# Patient Record
Sex: Male | Born: 1985 | Hispanic: Yes | Marital: Single
Health system: Southern US, Community
[De-identification: ages and names within clinical notes are randomized; demographics above are authoritative.]

---

## 2020-01-05 ENCOUNTER — Emergency Department (HOSPITAL_COMMUNITY): Payer: Medicaid Other

## 2020-01-05 ENCOUNTER — Emergency Department (HOSPITAL_COMMUNITY)
Admission: EM | Admit: 2020-01-05 | Discharge: 2020-01-05 | Disposition: A | Discharge status: Home / Self Care | Payer: Medicaid Other | Attending: Emergency Medicine | Admitting: Emergency Medicine

## 2020-01-05 ENCOUNTER — Encounter (HOSPITAL_COMMUNITY): Payer: Self-pay

## 2020-01-05 DIAGNOSIS — R109 Unspecified abdominal pain: Secondary | ICD-10-CM | POA: Diagnosis present

## 2020-01-05 DIAGNOSIS — N200 Calculus of kidney: Secondary | ICD-10-CM | POA: Insufficient documentation

## 2020-01-05 LAB — CBC WITH DIFFERENTIAL/PLATELET
Abs Immature Granulocytes: 0.07 10*3/uL (ref 0.00–0.07)
Basophils Absolute: 0.1 10*3/uL (ref 0.0–0.1)
Basophils Relative: 0 %
Eosinophils Absolute: 0 10*3/uL (ref 0.0–0.5)
Eosinophils Relative: 0 %
HCT: 50.3 % (ref 39.0–52.0)
Hemoglobin: 16.7 g/dL (ref 13.0–17.0)
Immature Granulocytes: 0 %
Lymphocytes Relative: 7 %
Lymphs Abs: 1.2 10*3/uL (ref 0.7–4.0)
MCH: 28.8 pg (ref 26.0–34.0)
MCHC: 33.2 g/dL (ref 30.0–36.0)
MCV: 86.7 fL (ref 80.0–100.0)
Monocytes Absolute: 0.7 10*3/uL (ref 0.1–1.0)
Monocytes Relative: 4 %
Neutro Abs: 15.5 10*3/uL — ABNORMAL HIGH (ref 1.7–7.7)
Neutrophils Relative %: 89 %
Platelets: 308 10*3/uL (ref 150–400)
RBC: 5.8 MIL/uL (ref 4.22–5.81)
RDW: 13.4 % (ref 11.5–15.5)
WBC: 17.4 10*3/uL — ABNORMAL HIGH (ref 4.0–10.5)
nRBC: 0 % (ref 0.0–0.2)

## 2020-01-05 LAB — URINALYSIS, ROUTINE W REFLEX MICROSCOPIC
Bacteria, UA: NONE SEEN
Bilirubin Urine: NEGATIVE
Glucose, UA: NEGATIVE mg/dL
Ketones, ur: NEGATIVE mg/dL
Leukocytes,Ua: NEGATIVE
Nitrite: NEGATIVE
Protein, ur: NEGATIVE mg/dL
RBC / HPF: >50 RBC/hpf — ABNORMAL HIGH (ref 0–5)
Specific Gravity, Urine: 1.015 (ref 1.005–1.030)
pH: 7 (ref 5.0–8.0)

## 2020-01-05 LAB — I-STAT CHEM 8, ED
BUN: 15 mg/dL (ref 6–20)
Calcium, Ion: 1.24 mmol/L (ref 1.15–1.40)
Chloride: 108 mmol/L (ref 98–111)
Creatinine, Ser: 1.1 mg/dL (ref 0.61–1.24)
Glucose, Bld: 147 mg/dL — ABNORMAL HIGH (ref 70–99)
HCT: 52 % (ref 39.0–52.0)
Hemoglobin: 17.7 g/dL — ABNORMAL HIGH (ref 13.0–17.0)
Potassium: 3.5 mmol/L (ref 3.5–5.1)
Sodium: 144 mmol/L (ref 135–145)
TCO2: 23 mmol/L (ref 22–32)

## 2020-01-05 MED ORDER — OXYCODONE-ACETAMINOPHEN 5-325 MG PO TABS
2.0000 | ORAL_TABLET | ORAL | 0 refills | Status: AC | PRN
Start: 2020-01-05 — End: ?

## 2020-01-05 MED ORDER — ONDANSETRON HCL 4 MG/2ML IJ SOLN
4.0000 mg | Freq: Once | INTRAMUSCULAR | Status: AC
  Administered 2020-01-05: 4 mg via INTRAVENOUS
  Filled 2020-01-05: qty 2

## 2020-01-05 MED ORDER — DICLOFENAC SODIUM ER 100 MG PO TB24: 100.0000 mg | ORAL_TABLET | Freq: Every day | ORAL | 0 refills | Status: AC

## 2020-01-05 MED ORDER — OXYCODONE-ACETAMINOPHEN 5-325 MG PO TABS
2.0000 | ORAL_TABLET | Freq: Once | ORAL | Status: AC
  Administered 2020-01-05: 2 via ORAL
  Filled 2020-01-05: qty 2

## 2020-01-05 MED ORDER — KETOROLAC TROMETHAMINE 30 MG/ML IJ SOLN
30.0000 mg | Freq: Once | INTRAMUSCULAR | Status: AC
  Administered 2020-01-05: 30 mg via INTRAVENOUS
  Filled 2020-01-05: qty 1

## 2020-01-05 MED ORDER — ONDANSETRON 8 MG PO TBDP: ORAL_TABLET | ORAL | 0 refills | Status: AC

## 2020-01-05 MED ORDER — TAMSULOSIN HCL 0.4 MG PO CAPS
0.4000 mg | ORAL_CAPSULE | ORAL | Status: AC
  Administered 2020-01-05: 0.4 mg via ORAL
  Filled 2020-01-05: qty 1

## 2020-01-05 NOTE — ED Provider Notes (Signed)
Signout from Dr. Daun Peacock.  34 year old male here with renal colic.  Has documented kidney stone.  Patient has not given a urine sample.  Plan is to follow-up on urine sample to make sure there is no signs of infection. Physical Exam  BP (!) 88/55 Comment (BP Location): patient asleep on left side  Pulse 66   Temp 98.1 F (36.7 C) (Oral)   Resp 16   SpO2 99%   Physical Exam  ED Course/Procedures     Procedures  MDM  Urine showing red blood cells but no clear signs of infection.  Will follow through with plan from Dr. Daun Peacock for discharge.       Terrilee Files, MD 01/05/20 [phone]

## 2020-01-05 NOTE — ED Provider Notes (Signed)
[facility]-EMERGENCY DEPT Provider Note   CSN: [id number] Arrival date & time: 01/05/20  0414     History No chief complaint on file.   Rodney Rosales is a 34 y.o. male.  The history is provided by the patient.  Flank Pain This is a new problem. The current episode started 3 to 5 hours ago. The problem occurs constantly. The problem has not changed since onset.Associated symptoms include abdominal pain. Pertinent negatives include no chest pain, no headaches and no shortness of breath. Nothing aggravates the symptoms. Nothing relieves the symptoms. He has tried nothing for the symptoms. The treatment provided no relief.  Left flank pain and feeling of needing to urinate but can't urinate since 230 am.  No f/c/r.  No emesis.       History reviewed. No pertinent past medical history.  There are no problems to display for this patient.   History reviewed. No pertinent surgical history.     History reviewed. No pertinent family history.  Social History   Tobacco Use  . Smoking status: Not on file  Substance Use Topics  . Alcohol use: Not on file  . Drug use: Not on file    Home Medications Prior to Admission medications   Medication Sig Start Date End Date Taking? Authorizing Provider  Diclofenac Sodium CR 100 MG 24 hr tablet Take 1 tablet (100 mg total) by mouth daily. 01/05/20   Eliud Polo, MD  ondansetron (ZOFRAN ODT) 8 MG disintegrating tablet 8mg  ODT q8 hours prn nausea 01/05/20   Elga Santy, MD  oxyCODONE-acetaminophen (PERCOCET) 5-325 MG tablet Take 2 tablets by mouth every 4 (four) hours as needed. 01/05/20   Fitzroy Mikami, MD    Allergies    Patient has no known allergies.  Review of Systems   Review of Systems  Constitutional: Negative for fever.  HENT: Negative for congestion.   Eyes: Negative for visual disturbance.  Respiratory: Negative for shortness of breath.   Cardiovascular: Negative for chest pain.    Gastrointestinal: Positive for abdominal pain.  Genitourinary: Positive for flank pain.  Musculoskeletal: Negative for arthralgias.  Skin: Negative for rash.  Neurological: Negative for headaches.  Psychiatric/Behavioral: Negative for agitation.  All other systems reviewed and are negative.   Physical Exam Updated Vital Signs BP (!) 145/98 (BP Location: Left Arm)   Pulse 60   Temp (!) 97.5 F (36.4 C) (Oral)   Resp 20   SpO2 100%   Physical Exam Vitals and nursing note reviewed.  Constitutional:      Appearance: Normal appearance.  HENT:     Head: Normocephalic and atraumatic.     Nose: Nose normal.  Eyes:     Conjunctiva/sclera: Conjunctivae normal.     Pupils: Pupils are equal, round, and reactive to light.  Cardiovascular:     Rate and Rhythm: Normal rate and regular rhythm.     Pulses: Normal pulses.     Heart sounds: Normal heart sounds.  Pulmonary:     Effort: Pulmonary effort is normal.     Breath sounds: Normal breath sounds.  Abdominal:     General: Abdomen is flat. Bowel sounds are normal.     Palpations: Abdomen is soft.     Tenderness: There is no abdominal tenderness. There is no guarding.  Musculoskeletal:        General: Normal range of motion.     Cervical back: Normal range of motion and neck supple.  Skin:    General: Skin is  warm and dry.     Capillary Refill: Capillary refill takes less than 2 seconds.  Neurological:     General: No focal deficit present.     Mental Status: He is alert and oriented to person, place, and time.     Deep Tendon Reflexes: Reflexes normal.  Psychiatric:        Mood and Affect: Mood normal.        Behavior: Behavior normal.     ED Results / Procedures / Treatments   Labs (all labs ordered are listed, but only abnormal results are displayed) Results for orders placed or performed during the hospital encounter of 01/05/20  CBC with Differential/Platelet  Result Value Ref Range   WBC 17.4 (H) 4.0 - 10.5 K/uL    RBC 5.80 4.22 - 5.81 MIL/uL   Hemoglobin 16.7 13.0 - 17.0 g/dL   HCT 95.1 39 - 52 %   MCV 86.7 80.0 - 100.0 fL   MCH 28.8 26.0 - 34.0 pg   MCHC 33.2 30.0 - 36.0 g/dL   RDW 88.4 16.6 - 06.3 %   Platelets 308 150 - 400 K/uL   nRBC 0.0 0.0 - 0.2 %   Neutrophils Relative % 89 %   Neutro Abs 15.5 (H) 1.7 - 7.7 K/uL   Lymphocytes Relative 7 %   Lymphs Abs 1.2 0.7 - 4.0 K/uL   Monocytes Relative 4 %   Monocytes Absolute 0.7 0.1 - 1.0 K/uL   Eosinophils Relative 0 %   Eosinophils Absolute 0.0 0.0 - 0.5 K/uL   Basophils Relative 0 %   Basophils Absolute 0.1 0.0 - 0.1 K/uL   Immature Granulocytes 0 %   Abs Immature Granulocytes 0.07 0.00 - 0.07 K/uL  I-stat chem 8, ED (not at [facility] or [facility])  Result Value Ref Range   Sodium 144 135 - 145 mmol/L   Potassium 3.5 3.5 - 5.1 mmol/L   Chloride 108 98 - 111 mmol/L   BUN 15 6 - 20 mg/dL   Creatinine, Ser 0.16 0.61 - 1.24 mg/dL   Glucose, Bld 010 (H) 70 - 99 mg/dL   Calcium, Ion 9.32 3.55 - 1.40 mmol/L   TCO2 23 22 - 32 mmol/L   Hemoglobin 17.7 (H) 13.0 - 17.0 g/dL   HCT 73.2 39 - 52 %   CT Renal Stone Study  Result Date: 01/05/2020 CLINICAL DATA:  Left flank pain, leukocytosis EXAM: CT ABDOMEN AND PELVIS WITHOUT CONTRAST TECHNIQUE: Multidetector CT imaging of the abdomen and pelvis was performed following the standard protocol without IV contrast. COMPARISON:  None. FINDINGS: Lower chest: No acute abnormality. Hepatobiliary: No focal liver abnormality is seen. No gallstones, gallbladder wall thickening, or biliary dilatation. Pancreas: Unremarkable Spleen: Unremarkable Adrenals/Urinary Tract: The adrenal glands are unremarkable. The kidneys are normal in size and position. There is mild left hydronephrosis secondary to an obstructing 2-3 mm calculus at the ureterovesicular junction. No additional nephro or urolithiasis identified. No hydronephrosis on the right. The bladder is unremarkable. Stomach/Bowel: Stomach is within normal limits. Appendix  appears normal. No evidence of bowel wall thickening, distention, or inflammatory changes. No free intraperitoneal gas or fluid. Vascular/Lymphatic: No significant vascular findings are present. No enlarged abdominal or pelvic lymph nodes. Reproductive: Prostate is unremarkable. Other: Rectum unremarkable Musculoskeletal: No acute bone abnormality IMPRESSION: Obstructing 2-3 mm calculus at the left ureterovesicular junction resulting in mild left hydronephrosis. Electronically Signed   By: Helyn Numbers MD   On: 01/05/2020 06:26    Radiology CT Renal [facility]  Study  Result Date: 01/05/2020 CLINICAL DATA:  Left flank pain, leukocytosis EXAM: CT ABDOMEN AND PELVIS WITHOUT CONTRAST TECHNIQUE: Multidetector CT imaging of the abdomen and pelvis was performed following the standard protocol without IV contrast. COMPARISON:  None. FINDINGS: Lower chest: No acute abnormality. Hepatobiliary: No focal liver abnormality is seen. No gallstones, gallbladder wall thickening, or biliary dilatation. Pancreas: Unremarkable Spleen: Unremarkable Adrenals/Urinary Tract: The adrenal glands are unremarkable. The kidneys are normal in size and position. There is mild left hydronephrosis secondary to an obstructing 2-3 mm calculus at the ureterovesicular junction. No additional nephro or urolithiasis identified. No hydronephrosis on the right. The bladder is unremarkable. Stomach/Bowel: Stomach is within normal limits. Appendix appears normal. No evidence of bowel wall thickening, distention, or inflammatory changes. No free intraperitoneal gas or fluid. Vascular/Lymphatic: No significant vascular findings are present. No enlarged abdominal or pelvic lymph nodes. Reproductive: Prostate is unremarkable. Other: Rectum unremarkable Musculoskeletal: No acute bone abnormality IMPRESSION: Obstructing 2-3 mm calculus at the left ureterovesicular junction resulting in mild left hydronephrosis. Electronically Signed   By: Helyn Numbers MD    On: 01/05/2020 06:26    Procedures Procedures (including critical care time)  Medications Ordered in ED Medications  oxyCODONE-acetaminophen (PERCOCET/ROXICET) 5-325 MG per tablet 2 tablet (has no administration in time range)  ketorolac (TORADOL) 30 MG/ML injection 30 mg (30 mg Intravenous Given 01/05/20 0551)  ondansetron (ZOFRAN) injection 4 mg (4 mg Intravenous Given 01/05/20 0549)  tamsulosin (FLOMAX) capsule 0.4 mg (0.4 mg Oral Given 01/05/20 0553)    ED Course  I have reviewed the triage vital signs and the nursing notes.  Pertinent labs & imaging results that were available during my care of the patient were reviewed by me and considered in my medical decision making (see chart for details).   Resting comfortably post medication.  Pain is a 6/10 prior to percocet.  Strain to be given.  Patient instructed to strain all urine and call urology to be seen in one week hence.  Patient is instructed he cannot drink alcohol or drive a car while taking narcotic pain medication.  Patient verbalizes understanding and agrees to follow up.    Rodney Rosales was evaluated in Emergency Department on 01/05/2020 for the symptoms described in the history of present illness. He was evaluated in the context of the global COVID-19 pandemic, which necessitated consideration that the patient might be at risk for infection with the SARS-CoV-2 virus that causes COVID-19. Institutional protocols and algorithms that pertain to the evaluation of patients at risk for COVID-19 are in a state of rapid change based on information released by regulatory bodies including the CDC and federal and state organizations. These policies and algorithms were followed during the patient's care in the ED.   Final Clinical Impression(s) / ED Diagnoses Final diagnoses:  Kidney stone   Signed out to Dr. Charm Barges pending urinalysis.    Rx / DC Orders ED Discharge Orders         Ordered    Diclofenac Sodium CR 100 MG 24 hr  tablet  Daily        01/05/20 0648    ondansetron (ZOFRAN ODT) 8 MG disintegrating tablet        01/05/20 0648    oxyCODONE-acetaminophen (PERCOCET) 5-325 MG tablet  Every 4 hours PRN        01/05/20 0648           Joedy Eickhoff, MD 01/05/20 1324

## 2020-01-05 NOTE — ED Notes (Signed)
Waiting on patient's urine specimen before discharge

## 2020-01-05 NOTE — ED Triage Notes (Signed)
Pt complains of acute pain on the left flank that woke him up about two am, he also states that he feels like he has to urinate and nothing comes out

## 2021-12-29 IMAGING — CT CT RENAL STONE PROTOCOL
2 of 4 series · 17 of 46 positions shown, 19 images · non-contrast
Comparison: None.

CLINICAL DATA: Left flank pain, leukocytosis

EXAM:
CT ABDOMEN AND PELVIS WITHOUT CONTRAST
TECHNIQUE: Multidetector CT imaging of the abdomen and pelvis was performed
following the standard protocol without IV contrast.

[Series 2: axial st · axial · 0.69mm/px · z∈[+1099,+1459]mm · 14 of 81 slices shown, 16 images]
[im 5/81  soft-tissue]
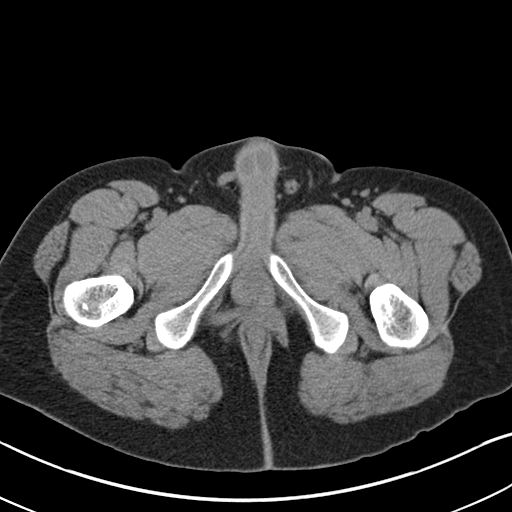
[im 5/81  bone]
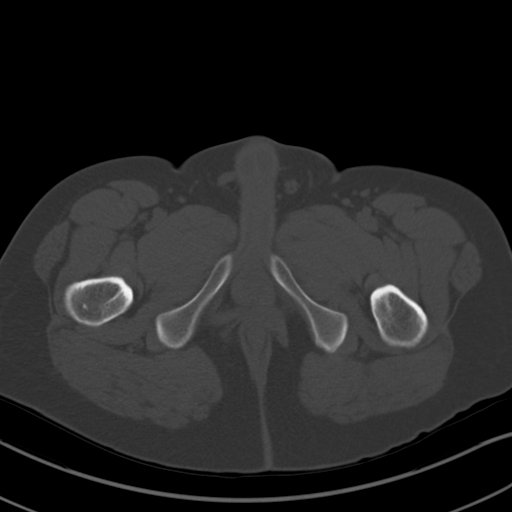
[im 13/81  soft-tissue]
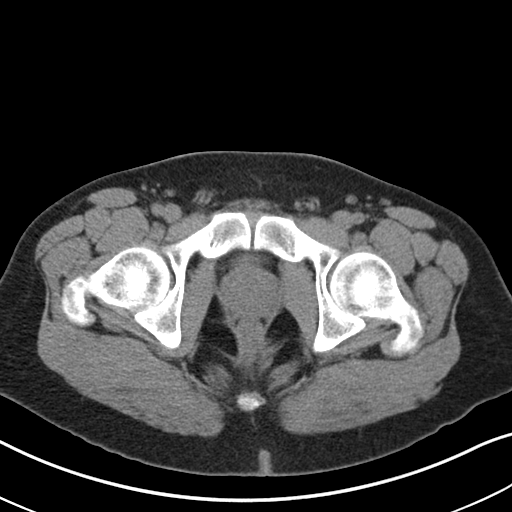
[im 17/81  soft-tissue]
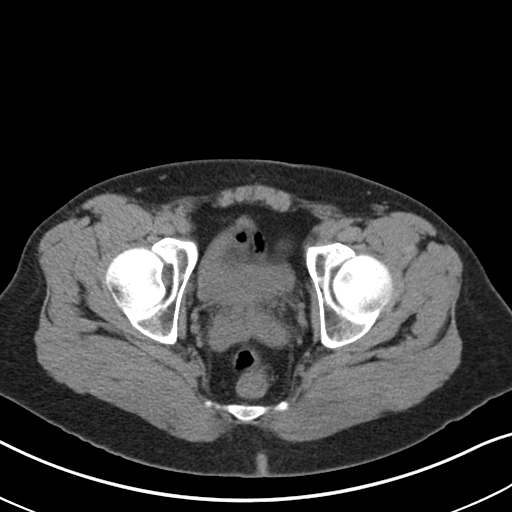
[im 21/81  soft-tissue]
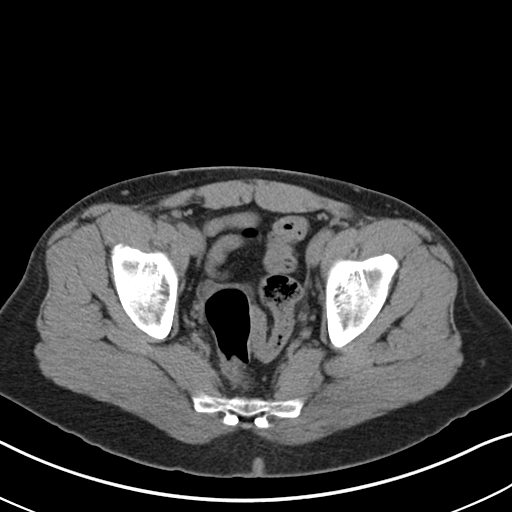
[im 29/81  soft-tissue]
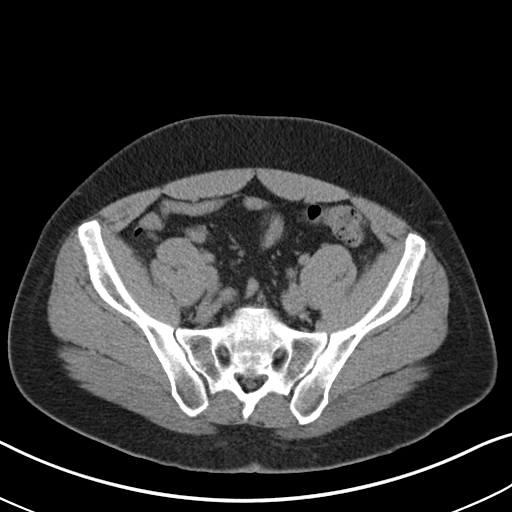
[im 33/81  soft-tissue]
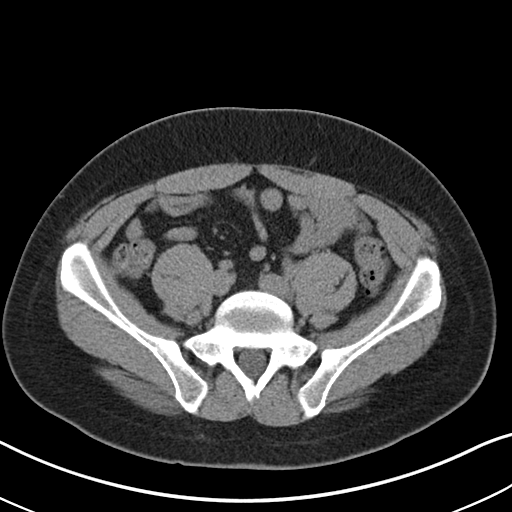
[im 37/81  soft-tissue]
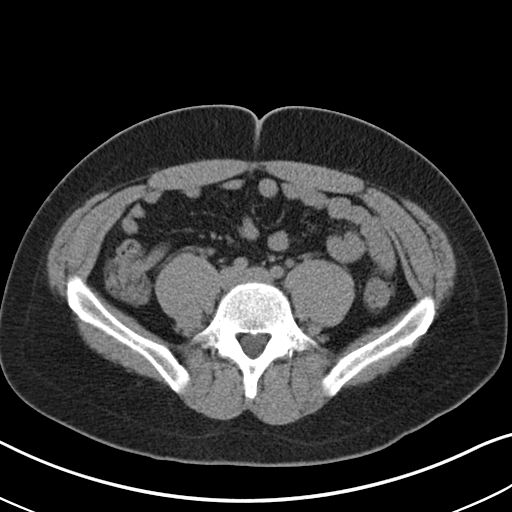
[im 45/81  soft-tissue]
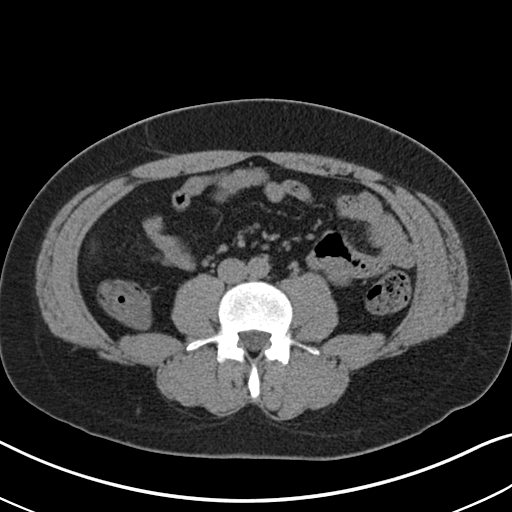
[im 49/81  soft-tissue]
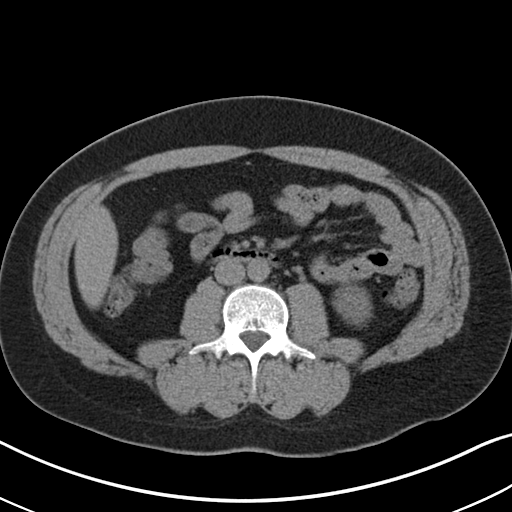
[im 49/81  bone]
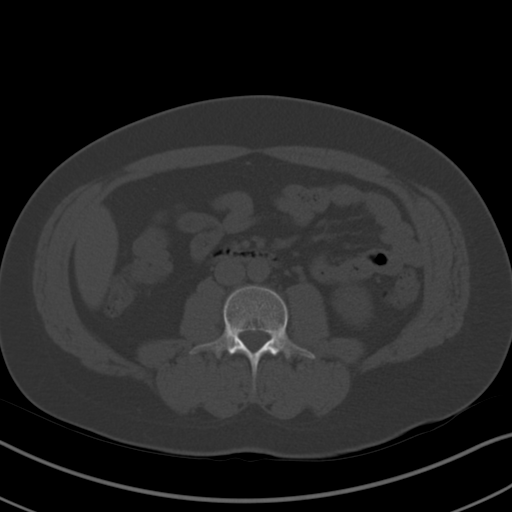
[im 53/81  soft-tissue]
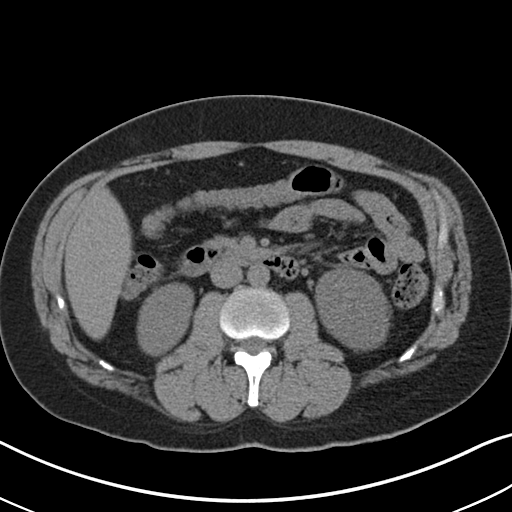
[im 61/81  soft-tissue]
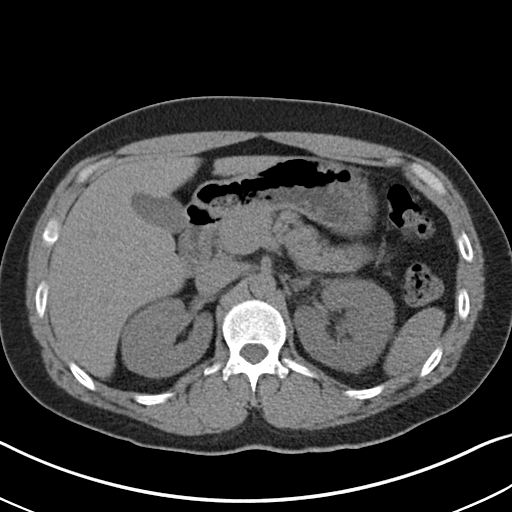
[im 65/81  soft-tissue]
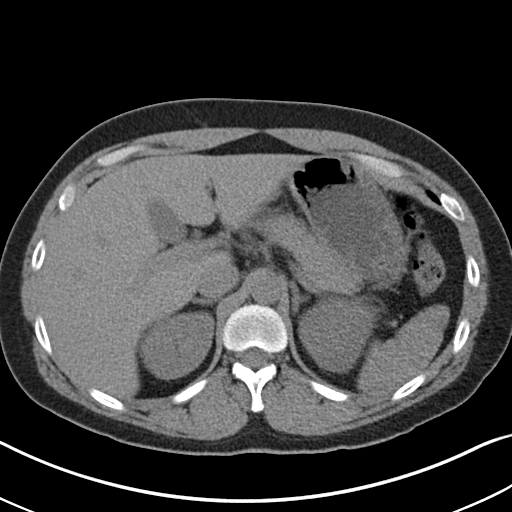
[im 69/81  soft-tissue]
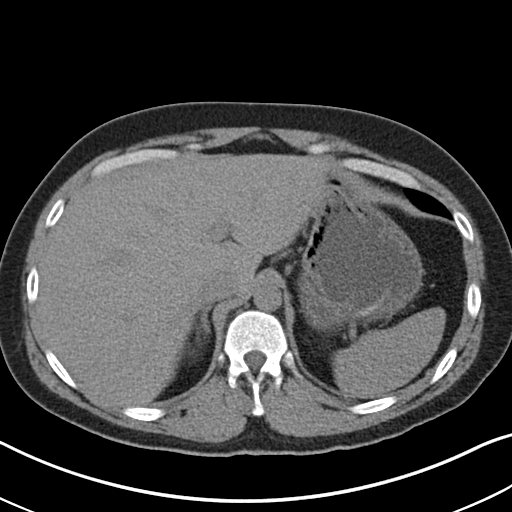
[im 77/81  soft-tissue]
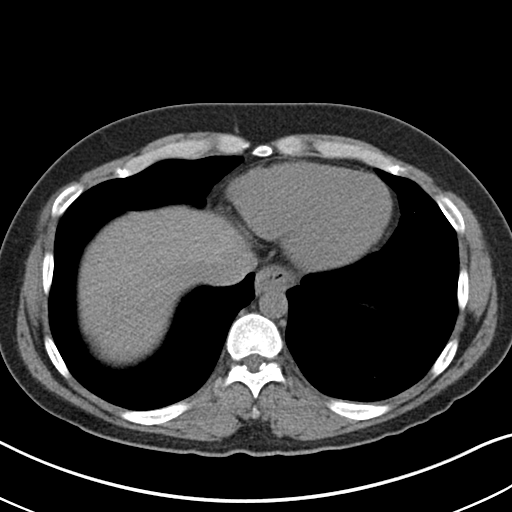

[Series 4: coronal · coronal · 0.67mm/px · 3 of 123 slices shown]
[im 41/123  soft-tissue]
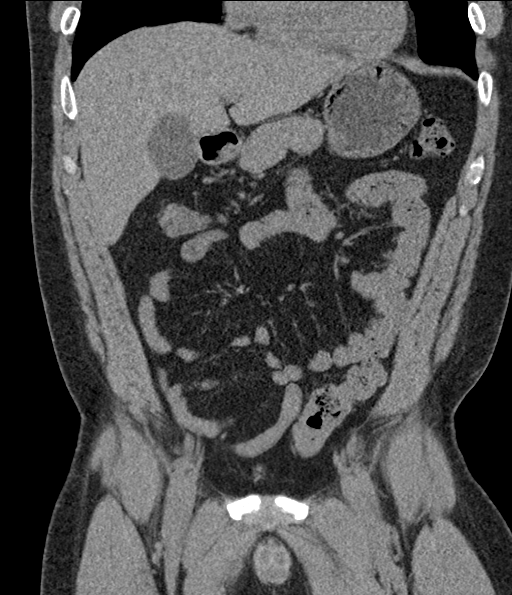
[im 55/123  soft-tissue]
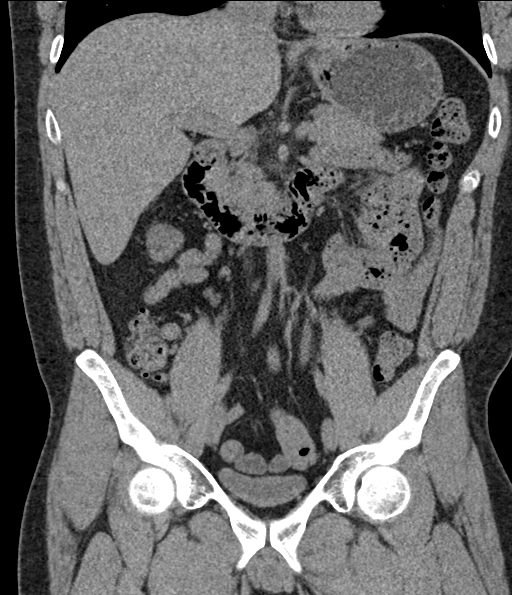
[im 68/123  soft-tissue]
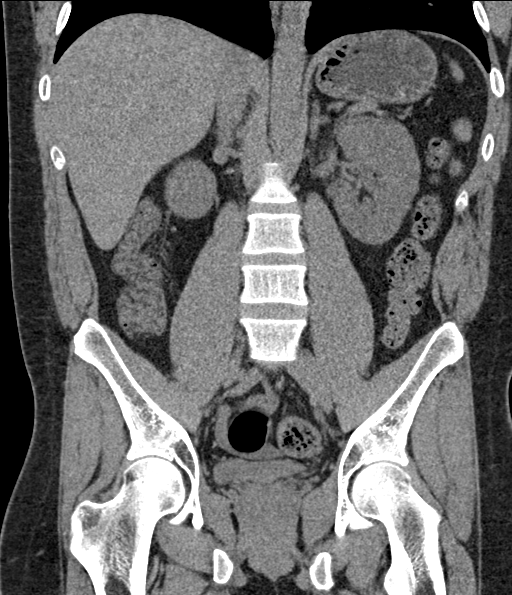

[17 of 46 positions shown; findings below may reference images not displayed]

FINDINGS: Lower chest: No acute abnormality.

Hepatobiliary: No focal liver abnormality is seen. No gallstones,
gallbladder wall thickening, or biliary dilatation.

Pancreas: Unremarkable

Spleen: Unremarkable

Adrenals/Urinary Tract: The adrenal glands are unremarkable. The
kidneys are normal in size and position. There is mild left
hydronephrosis secondary to an obstructing 2-3 mm calculus at the
ureterovesicular junction. No additional nephro or urolithiasis
identified. No hydronephrosis on the right. The bladder is
unremarkable.

Stomach/Bowel: Stomach is within normal limits. Appendix appears
normal. No evidence of bowel wall thickening, distention, or
inflammatory changes. No free intraperitoneal gas or fluid.

Vascular/Lymphatic: No significant vascular findings are present. No
enlarged abdominal or pelvic lymph nodes.

Reproductive: Prostate is unremarkable.

Other: Rectum unremarkable

Musculoskeletal: No acute bone abnormality
IMPRESSION: Obstructing 2-3 mm calculus at the left ureterovesicular junction
resulting in mild left hydronephrosis.
